# Patient Record
Sex: Female | Born: 1995 | Race: Black or African American | Hispanic: No | Marital: Single | Smoking: Never smoker
Health system: Southern US, Community
[De-identification: ages and names within clinical notes are randomized; demographics above are authoritative.]

---

## 2020-02-23 ENCOUNTER — Encounter: Payer: Self-pay | Admitting: Emergency Medicine

## 2020-02-23 ENCOUNTER — Emergency Department
Admission: EM | Admit: 2020-02-23 | Discharge: 2020-02-23 | Disposition: A | Discharge status: Home / Self Care | Payer: Managed Care, Other (non HMO) | Attending: Emergency Medicine | Admitting: Emergency Medicine

## 2020-02-23 ENCOUNTER — Emergency Department: Payer: Managed Care, Other (non HMO)

## 2020-02-23 ENCOUNTER — Other Ambulatory Visit: Payer: Self-pay

## 2020-02-23 DIAGNOSIS — R002 Palpitations: Secondary | ICD-10-CM | POA: Insufficient documentation

## 2020-02-23 DIAGNOSIS — R0602 Shortness of breath: Secondary | ICD-10-CM | POA: Diagnosis present

## 2020-02-23 DIAGNOSIS — Z5321 Procedure and treatment not carried out due to patient leaving prior to being seen by health care provider: Secondary | ICD-10-CM | POA: Diagnosis not present

## 2020-02-23 LAB — BASIC METABOLIC PANEL
Anion gap: 11 (ref 5–15)
BUN: 8 mg/dL (ref 6–20)
CO2: 26 mmol/L (ref 22–32)
Calcium: 9.4 mg/dL (ref 8.9–10.3)
Chloride: 102 mmol/L (ref 98–111)
Creatinine, Ser: 0.78 mg/dL (ref 0.44–1.00)
GFR, Estimated: >60 mL/min (ref >60)
Glucose, Bld: 94 mg/dL (ref 70–99)
Potassium: 3.7 mmol/L (ref 3.5–5.1)
Sodium: 139 mmol/L (ref 135–145)

## 2020-02-23 LAB — CBC
HCT: 39 % (ref 36.0–46.0)
Hemoglobin: 12.9 g/dL (ref 12.0–15.0)
MCH: 29.4 pg (ref 26.0–34.0)
MCHC: 33.1 g/dL (ref 30.0–36.0)
MCV: 88.8 fL (ref 80.0–100.0)
Platelets: 291 10*3/uL (ref 150–400)
RBC: 4.39 MIL/uL (ref 3.87–5.11)
RDW: 12.2 % (ref 11.5–15.5)
WBC: 8.4 10*3/uL (ref 4.0–10.5)
nRBC: 0 % (ref 0.0–0.2)

## 2020-02-23 LAB — TROPONIN I (HIGH SENSITIVITY)
Troponin I (High Sensitivity): <2 ng/L (ref <18)
Troponin I (High Sensitivity): <2 ng/L (ref <18)

## 2020-02-23 LAB — POCT PREGNANCY, URINE: Preg Test, Ur: NEGATIVE

## 2020-02-23 NOTE — ED Notes (Signed)
Pt to stat desk states she is leaving due to long wait times.  Will return if sx worsen or change

## 2020-02-23 NOTE — ED Triage Notes (Signed)
Pt reports she has been feeling for the past week she has been experiencing shortness of breath and palpitations, pt reports episodes come and go,reports experienced one episode this morning. Pt talks in complete sentences no respiratory distress noted

## 2020-02-24 ENCOUNTER — Encounter: Payer: Self-pay | Admitting: Emergency Medicine

## 2020-02-24 ENCOUNTER — Ambulatory Visit
Admission: RE | Admit: 2020-02-24 | Discharge: 2020-02-24 | Disposition: A | Discharge status: Home / Self Care | Payer: Managed Care, Other (non HMO) | Source: Ambulatory Visit | Attending: Family Medicine | Admitting: Family Medicine

## 2020-02-24 VITALS — BP 138/99 | HR 86 | Temp 98.7°F | Resp 16

## 2020-02-24 DIAGNOSIS — R0602 Shortness of breath: Secondary | ICD-10-CM

## 2020-02-24 DIAGNOSIS — I10 Essential (primary) hypertension: Secondary | ICD-10-CM

## 2020-02-24 DIAGNOSIS — F411 Generalized anxiety disorder: Secondary | ICD-10-CM | POA: Diagnosis not present

## 2020-02-24 DIAGNOSIS — R002 Palpitations: Secondary | ICD-10-CM

## 2020-02-24 DIAGNOSIS — Z1152 Encounter for screening for COVID-19: Secondary | ICD-10-CM

## 2020-02-24 MED ORDER — HYDROXYZINE HCL 10 MG PO TABS: 10.0000 mg | ORAL_TABLET | Freq: Three times a day (TID) | ORAL | 0 refills | Status: AC | PRN

## 2020-02-24 NOTE — ED Triage Notes (Signed)
Patient reports palpitations and shortness of breath x 1 week. Patient went to ED on Saturday--EKG, Chest Xray and labs completed. Patient was not seen by a provider.   Patient states symptoms have continued and would like to review information from Saturday.

## 2020-02-24 NOTE — ED Provider Notes (Signed)
Cindy Chambers    CSN: [id number] Arrival date & time: 02/24/20  1024      History   Chief Complaint Chief Complaint  Patient presents with  . Palpitations  . Shortness of Breath    HPI Cindy Chambers is a 24 y.o. female.   Reports that she has been experiencing palpitations and shortness of breath and episodes together for about the past week.  Reports that the episodes last from 2 to 3 minutes to 30 minutes.  Reports that she had the worst episode of this Saturday night and then went to the ER.  She does not have significant medical history.  Reports that they took her blood did an x-ray as well, and an EKG.  She did not stay due to long wait times.  She was wondering what her labs looked like from ther  Denies headache, cough, shortness of breath, nausea, vomiting, diarrhea, rash, fever, other symptoms.  Requesting Covid testing while she is here.  Has negative history of Covid has completed Covid vaccines.   ROS per HPI  The history is provided by the patient.  Palpitations Associated symptoms: shortness of breath   Shortness of Breath   History reviewed. No pertinent past medical history.  There are no problems to display for this patient.   History reviewed. No pertinent surgical history.  OB History   No obstetric history on file.      Home Medications    Prior to Admission medications   Medication Sig Start Date End Date Taking? Authorizing Provider  hydrOXYzine (ATARAX/VISTARIL) 10 MG tablet Take 1 tablet (10 mg total) by mouth 3 (three) times daily as needed. 02/24/20   Moshe Cipro, NP    Family History History reviewed. No pertinent family history.  Social History Social History   Tobacco Use  . Smoking status: Never Smoker  . Smokeless tobacco: Never Used  Substance Use Topics  . Alcohol use: Not on file  . Drug use: Not on file     Allergies   Patient has no known allergies.   Review of Systems Review of Systems    Respiratory: Positive for shortness of breath.   Cardiovascular: Positive for palpitations.     Physical Exam Triage Vital Signs ED Triage Vitals  Enc Vitals Group     BP 02/24/20 1101 (!) 138/99     Pulse Rate 02/24/20 1101 86     Resp 02/24/20 1101 16     Temp 02/24/20 1101 98.7 F (37.1 C)     Temp Source 02/24/20 1101 Oral     SpO2 02/24/20 1101 98 %     Weight --      Height --      Head Circumference --      Peak Flow --      Pain Score 02/24/20 1044 2     Pain Loc --      Pain Edu? --      Excl. in GC? --    No data found.  Updated Vital Signs BP (!) 138/99 (BP Location: Left Arm)   Pulse 86   Temp 98.7 F (37.1 C) (Oral)   Resp 16   LMP 01/17/2020   SpO2 98%   Visual Acuity Right Eye Distance:   Left Eye Distance:   Bilateral Distance:    Right Eye Near:   Left Eye Near:    Bilateral Near:     Physical Exam Vitals and nursing note reviewed.  Constitutional:  General: She is not in acute distress.    Appearance: She is well-developed. She is obese. She is not ill-appearing.  HENT:     Head: Normocephalic and atraumatic.     Nose: Nose normal.     Mouth/Throat:     Mouth: Mucous membranes are moist.     Pharynx: Oropharynx is clear.  Eyes:     Extraocular Movements: Extraocular movements intact.     Conjunctiva/sclera: Conjunctivae normal.     Pupils: Pupils are equal, round, and reactive to light.  Cardiovascular:     Rate and Rhythm: Normal rate and regular rhythm.     Heart sounds: Normal heart sounds. No murmur heard.   Pulmonary:     Effort: Pulmonary effort is normal. No respiratory distress.     Breath sounds: Normal breath sounds. No stridor. No wheezing, rhonchi or rales.  Chest:     Chest wall: No tenderness.  Abdominal:     General: Bowel sounds are normal.     Palpations: Abdomen is soft.     Tenderness: There is no abdominal tenderness.  Musculoskeletal:        General: Normal range of motion.     Cervical back:  Normal range of motion and neck supple.  Skin:    General: Skin is warm and dry.     Capillary Refill: Capillary refill takes less than 2 seconds.  Neurological:     General: No focal deficit present.     Mental Status: She is alert and oriented to person, place, and time.  Psychiatric:        Mood and Affect: Mood normal.        Behavior: Behavior normal.        Thought Content: Thought content normal.      UC Treatments / Results  Labs (all labs ordered are listed, but only abnormal results are displayed) Labs Reviewed  NOVEL CORONAVIRUS, NAA    EKG   Radiology No results found.  Procedures Procedures (including critical care time)  Medications Ordered in UC Medications - No data to display  Initial Impression / Assessment and Plan / UC Course  I have reviewed the triage vital signs and the nursing notes.  Pertinent labs & imaging results that were available during my care of the patient were reviewed by me and considered in my medical decision making (see chart for details).     Palpitations SOB Covid screen Anxiety  Presents today with palpitations and shortness of breath that occur episodically together Has never had any issues with anxiety or high blood pressure before BP elevated in office today Discussed keeping blood pressure log for the next 2 weeks the follow-up with primary care Most likely anxiety as she states that her thoughts tend to race and she has a hard time falling asleep Prescribed hydroxyzine as needed Covid swab obtained in office today.  Patient instructed to quarantine until results are back and negative.  If results are negative, patient may resume daily schedule as tolerated once they are fever free for 24 hours without the use of antipyretic medications.  If results are positive, patient instructed to quarantine 10 days from today.  Patient instructed to follow-up with primary care with this office as needed.  Patient instructed to  follow-up in the ER for trouble swallowing, trouble breathing, other concerning symptoms.  Final Clinical Impressions(s) / UC Diagnoses   Final diagnoses:  Palpitations  SOB (shortness of breath)  Encounter for screening for COVID-19  Anxiety  state     Discharge Instructions     Your COVID test is pending.  You should self quarantine until the test result is back.    Take Tylenol as needed for fever or discomfort.  Rest and keep yourself hydrated.    Go to the emergency department if you develop acute worsening symptoms.        ED Prescriptions    Medication Sig Dispense Auth. Provider   hydrOXYzine (ATARAX/VISTARIL) 10 MG tablet Take 1 tablet (10 mg total) by mouth 3 (three) times daily as needed. 30 tablet Moshe Cipro, NP     PDMP not reviewed this encounter.   Moshe Cipro, NP 02/24/20 1204

## 2020-02-24 NOTE — Discharge Instructions (Signed)
Your COVID test is pending.  You should self quarantine until the test result is back.    Take Tylenol as needed for fever or discomfort.  Rest and keep yourself hydrated.    Go to the emergency department if you develop acute worsening symptoms.     

## 2020-02-25 LAB — NOVEL CORONAVIRUS, NAA: SARS-CoV-2, NAA: NOT DETECTED

## 2020-02-25 LAB — SARS-COV-2, NAA 2 DAY TAT

## 2020-03-05 ENCOUNTER — Emergency Department
Admission: EM | Admit: 2020-03-05 | Discharge: 2020-03-05 | Disposition: A | Discharge status: Home / Self Care | Payer: Managed Care, Other (non HMO) | Attending: Emergency Medicine | Admitting: Emergency Medicine

## 2020-03-05 ENCOUNTER — Other Ambulatory Visit: Payer: Self-pay

## 2020-03-05 DIAGNOSIS — J029 Acute pharyngitis, unspecified: Secondary | ICD-10-CM | POA: Insufficient documentation

## 2020-03-05 DIAGNOSIS — Z20822 Contact with and (suspected) exposure to covid-19: Secondary | ICD-10-CM | POA: Insufficient documentation

## 2020-03-05 LAB — GROUP A STREP BY PCR: Group A Strep by PCR: NOT DETECTED

## 2020-03-05 LAB — RESPIRATORY PANEL BY RT PCR (FLU A&B, COVID)
Influenza A by PCR: NEGATIVE
Influenza B by PCR: NEGATIVE
SARS Coronavirus 2 by RT PCR: NEGATIVE

## 2020-03-05 NOTE — ED Notes (Signed)
Patient declined discharge vital signs. 

## 2020-03-05 NOTE — ED Triage Notes (Signed)
Pt states feels like tonsils are swollen states normally gets tonsillitis yearly.

## 2020-03-05 NOTE — ED Provider Notes (Signed)
Emergency Department Provider Note  ____________________________________________  Time seen: Approximately 10:20 PM  I have reviewed the triage vital signs and the nursing notes.   HISTORY  Chief Complaint Sore Throat   Historian Patient     HPI Cindy Chambers is a 24 y.o. female presents to the emergency department with concern for an episode of globus sensation that occurred while patient was driving.  Patient states that she looked at the back of her throat after she arrived at her destination and noticed her uvula sticking to her tonsil and became concerned.  She denies difficulty swallowing, pain underneath the tongue or current pharyngitis.  No fever or chills.  No shortness of breath.  No other alleviating measures have been attempted.   No past medical history on file.   Immunizations up to date:  Yes.     No past medical history on file.  There are no problems to display for this patient.   No past surgical history on file.  Prior to Admission medications   Medication Sig Start Date End Date Taking? Authorizing Provider  hydrOXYzine (ATARAX/VISTARIL) 10 MG tablet Take 1 tablet (10 mg total) by mouth 3 (three) times daily as needed. 02/24/20   Moshe Cipro, NP    Allergies Patient has no known allergies.  No family history on file.  Social History Social History   Tobacco Use  . Smoking status: Never Smoker  . Smokeless tobacco: Never Used  Substance Use Topics  . Alcohol use: Not on file  . Drug use: Not on file     Review of Systems  Constitutional: No fever/chills Eyes:  No discharge ENT: No upper respiratory complaints. Respiratory: no cough. No SOB/ use of accessory muscles to breath Gastrointestinal:   No nausea, no vomiting.  No diarrhea.  No constipation. Musculoskeletal: Negative for musculoskeletal pain. Skin: Negative for rash, abrasions, lacerations,  ecchymosis.    ____________________________________________   PHYSICAL EXAM:  VITAL SIGNS: ED Triage Vitals  Enc Vitals Group     BP 03/05/20 1911 (!) 150/84     Pulse Rate 03/05/20 1911 75     Resp 03/05/20 1911 18     Temp 03/05/20 1910 98.7 F (37.1 C)     Temp Source 03/05/20 1910 Oral     SpO2 03/05/20 1911 100 %     Weight 03/05/20 1910 203 lb (92.1 kg)     Height 03/05/20 1910 5\' 3"  (1.6 m)     Head Circumference --      Peak Flow --      Pain Score 03/05/20 1910 3     Pain Loc --      Pain Edu? --      Excl. in GC? --      Constitutional: Alert and oriented. Well appearing and in no acute distress. Eyes: Conjunctivae are normal. PERRL. EOMI. Head: Atraumatic. ENT:      Ears: TMs are pearly.       Nose: No congestion/rhinnorhea.      Mouth/Throat: Mucous membranes are moist.  No uvular deviation.  Tonsils appear symmetrical without hypertrophy.  No tonsillar exudate. Neck: No stridor.  No cervical spine tenderness to palpation. Cardiovascular: Normal rate, regular rhythm. Normal S1 and S2.  Good peripheral circulation. Respiratory: Normal respiratory effort without tachypnea or retractions. Lungs CTAB. Good air entry to the bases with no decreased or absent breath sounds Musculoskeletal: Full range of motion to all extremities. No obvious deformities noted Neurologic:  Normal for age. No gross focal  neurologic deficits are appreciated.  Skin:  Skin is warm, dry and intact. No rash noted. Psychiatric: Mood and affect are normal for age. Speech and behavior are normal.   ____________________________________________   LABS (all labs ordered are listed, but only abnormal results are displayed)  Labs Reviewed  GROUP A STREP BY PCR  RESPIRATORY PANEL BY RT PCR (FLU A&B, COVID)   ____________________________________________  EKG   ____________________________________________  RADIOLOGY   No results  found.  ____________________________________________    PROCEDURES  Procedure(s) performed:     Procedures     Medications - No data to display   ____________________________________________   INITIAL IMPRESSION / ASSESSMENT AND PLAN / ED COURSE  Pertinent labs & imaging results that were available during my care of the patient were reviewed by me and considered in my medical decision making (see chart for details).      Assessment and Plan:  Pharyngitis 24 year old female presents to the emergency department after resolved episode of globus sensation.  Patient was mildly hypertensive at triage but vital signs were otherwise reassuring.  On physical exam, tonsils appeared symmetrical with no uvular deviation.  Patient tested negative for group A strep.  COVID-19 testing is in process at this time.  Reassurance was given.  All patient questions were answered.   ____________________________________________  FINAL CLINICAL IMPRESSION(S) / ED DIAGNOSES  Final diagnoses:  Pharyngitis, unspecified etiology      NEW MEDICATIONS STARTED DURING THIS VISIT:  ED Discharge Orders    None          This chart was dictated using voice recognition software/Dragon. Despite best efforts to proofread, errors can occur which can change the meaning. Any change was purely unintentional.     Orvil Feil, PA-C 03/05/20 2225    Shaune Pollack, MD 03/10/20 [phone]

## 2020-05-04 ENCOUNTER — Ambulatory Visit
Admission: EM | Admit: 2020-05-04 | Discharge: 2020-05-04 | Disposition: A | Discharge status: Home / Self Care | Payer: Managed Care, Other (non HMO) | Attending: Emergency Medicine | Admitting: Emergency Medicine

## 2020-05-04 DIAGNOSIS — J358 Other chronic diseases of tonsils and adenoids: Secondary | ICD-10-CM | POA: Diagnosis present

## 2020-05-04 LAB — POCT RAPID STREP A (OFFICE): Rapid Strep A Screen: NEGATIVE

## 2020-05-04 NOTE — Discharge Instructions (Signed)
Negative today for strep.  Your exam is consistent with a tonsil stone, which can recur for some people.  Gargling, or even use of a water pic to extract stones may provide comfort.  Stay well hydrated to try to prevent these.  Follow up with ENT as needed if recurrence or worsening.

## 2020-05-04 NOTE — ED Provider Notes (Signed)
[name]    CSN: [id number] Arrival date & time: 05/04/20  1337      History   Chief Complaint Chief Complaint  Patient presents with   Sore Throat    HPI Carnelia [place] is a 24 y.o. female.   Right tonsilar pain over the past few days. She was seen and evaluated for sore throat 10/21 in the ER with negative testing. Symptoms worsened, she did an e-visit and was provided course of amoxicillin which she finished approximately a month ago, symptoms had improved. Noted "white spot" to right tonsil a few days ago again which worried her. No fevers. Mild right ear pain. No other congestion or cough. No difficulty swallowing.    ROS per HPI, negative if not otherwise mentioned.      History reviewed. No pertinent past medical history.  There are no problems to display for this patient.   History reviewed. No pertinent surgical history.  OB History   No obstetric history on file.      Home Medications    Prior to Admission medications   Medication Sig Start Date End Date Taking? Authorizing Provider  hydrOXYzine (ATARAX/VISTARIL) 10 MG tablet Take 1 tablet (10 mg total) by mouth 3 (three) times daily as needed. 02/24/20   Moshe Cipro, NP    Family History No family history on file.  Social History Social History   Tobacco Use   Smoking status: Never Smoker   Smokeless tobacco: Never Used  Substance Use Topics   Alcohol use: Yes    Comment: occ     Allergies   Patient has no known allergies.   Review of Systems Review of Systems   Physical Exam Triage Vital Signs ED Triage Vitals  Enc Vitals Group     BP 05/04/20 1347 (!) 152/95     Pulse Rate 05/04/20 1347 94     Resp 05/04/20 1347 16     Temp 05/04/20 1347 98.9 F (37.2 C)     Temp Source 05/04/20 1347 Oral     SpO2 05/04/20 1347 97 %     Weight 05/04/20 1347 200 lb (90.7 kg)     Height 05/04/20 1347 5\' 2"  (1.575 m)     Head Circumference --      Peak Flow --       Pain Score 05/04/20 1348 8     Pain Loc --      Pain Edu? --      Excl. in GC? --    No data found.  Updated Vital Signs BP (!) 152/95    Pulse 94    Temp 98.9 F (37.2 C) (Oral)    Resp 16    Ht 5\' 2"  (1.575 m)    Wt 200 lb (90.7 kg)    LMP 04/11/2020    SpO2 97%    BMI 36.58 kg/m    Physical Exam Constitutional:      General: She is not in acute distress.    Appearance: She is well-developed.  HENT:     Head: Normocephalic and atraumatic.     Right Ear: Tympanic membrane and ear canal normal.     Left Ear: Tympanic membrane and ear canal normal.     Mouth/Throat:     Mouth: Mucous membranes are moist.     Pharynx: Uvula midline. No oropharyngeal exudate, posterior oropharyngeal erythema or uvula swelling.     Tonsils: No tonsillar exudate or tonsillar abscesses. 1+ on the right. 1+ on the  left.     Comments: Tonsil stone x1 noted to right tonsil  Cardiovascular:     Rate and Rhythm: Normal rate.  Pulmonary:     Effort: Pulmonary effort is normal.  Skin:    General: Skin is warm and dry.  Neurological:     Mental Status: She is alert and oriented to person, place, and time.      UC Treatments / Results  Labs (all labs ordered are listed, but only abnormal results are displayed) Labs Reviewed  CULTURE, GROUP A STREP [facility])  POCT RAPID STREP A (OFFICE)    EKG   Radiology No results found.  Procedures Procedures (including critical care time)  Medications Ordered in UC Medications - No data to display  Initial Impression / Assessment and Plan / UC Course  I have reviewed the triage vital signs and the nursing notes.  Pertinent labs & imaging results that were available during my care of the patient were reviewed by me and considered in my medical decision making (see chart for details).     Negative rapid strep again here today, consistent with exam. Single tonsil stone noted. Treatment and prevention, supportive cares recommended. Follow up with ENT  prn recommended. Patient verbalized understanding and agreeable to plan.   Final Clinical Impressions(s) / UC Diagnoses   Final diagnoses:  Tonsil stone     Discharge Instructions     Negative today for strep.  Your exam is consistent with a tonsil stone, which can recur for some people.  Gargling, or even use of a water pic to extract stones may provide comfort.  Stay well hydrated to try to prevent these.  Follow up with ENT as needed if recurrence or worsening.     ED Prescriptions    None     PDMP not reviewed this encounter.   Georgetta Haber, NP 05/04/20 1422

## 2020-05-04 NOTE — ED Triage Notes (Signed)
Pt reports having a sore throat "for awhile". sts she was seen in Oct at the ED and tested for strep, test was neg. sts her tonsils are swollen and have some white spots in back of throatt. Also sts she had an e-visit on Nov. 23 and was prescribed abx.

## 2020-05-06 LAB — CULTURE, GROUP A STREP (THRC)

## 2021-05-28 ENCOUNTER — Telehealth: Payer: Self-pay

## 2021-05-28 NOTE — Telephone Encounter (Signed)
FYI  Placed call to pt to triage due to her new patient appt notes saying chest pain. Pt states its been going on for awhile. She went to [facility] in 2021 for it and they stated she was having anxiety. Pt states the chest pain is on and off and she doesn't usually have nay other symptoms along with it. I advised pt to go to the ER again if it gets worse and to keep her New pt appt scheduled for 3/8. Informed pt to call office back to check if there are any cancellations.

## 2021-05-29 NOTE — Telephone Encounter (Signed)
Yes agree she needs to go to the ER now  chest pain is really not appropriate for new patient establish care visit. She needs work up that vannot be done in office I recommend ER and follow up as new patient afterwards.

## 2021-07-01 ENCOUNTER — Ambulatory Visit: Payer: Managed Care, Other (non HMO) | Admitting: Adult Health

## 2021-07-21 ENCOUNTER — Ambulatory Visit: Payer: Managed Care, Other (non HMO) | Admitting: Adult Health

## 2022-06-07 IMAGING — DX DG CHEST 1V PORT
1 series · 1 of 1 positions shown · non-contrast
Comparison: None.

CLINICAL DATA: Chest pain

EXAM:
PORTABLE CHEST 1 VIEW

[chest ap]
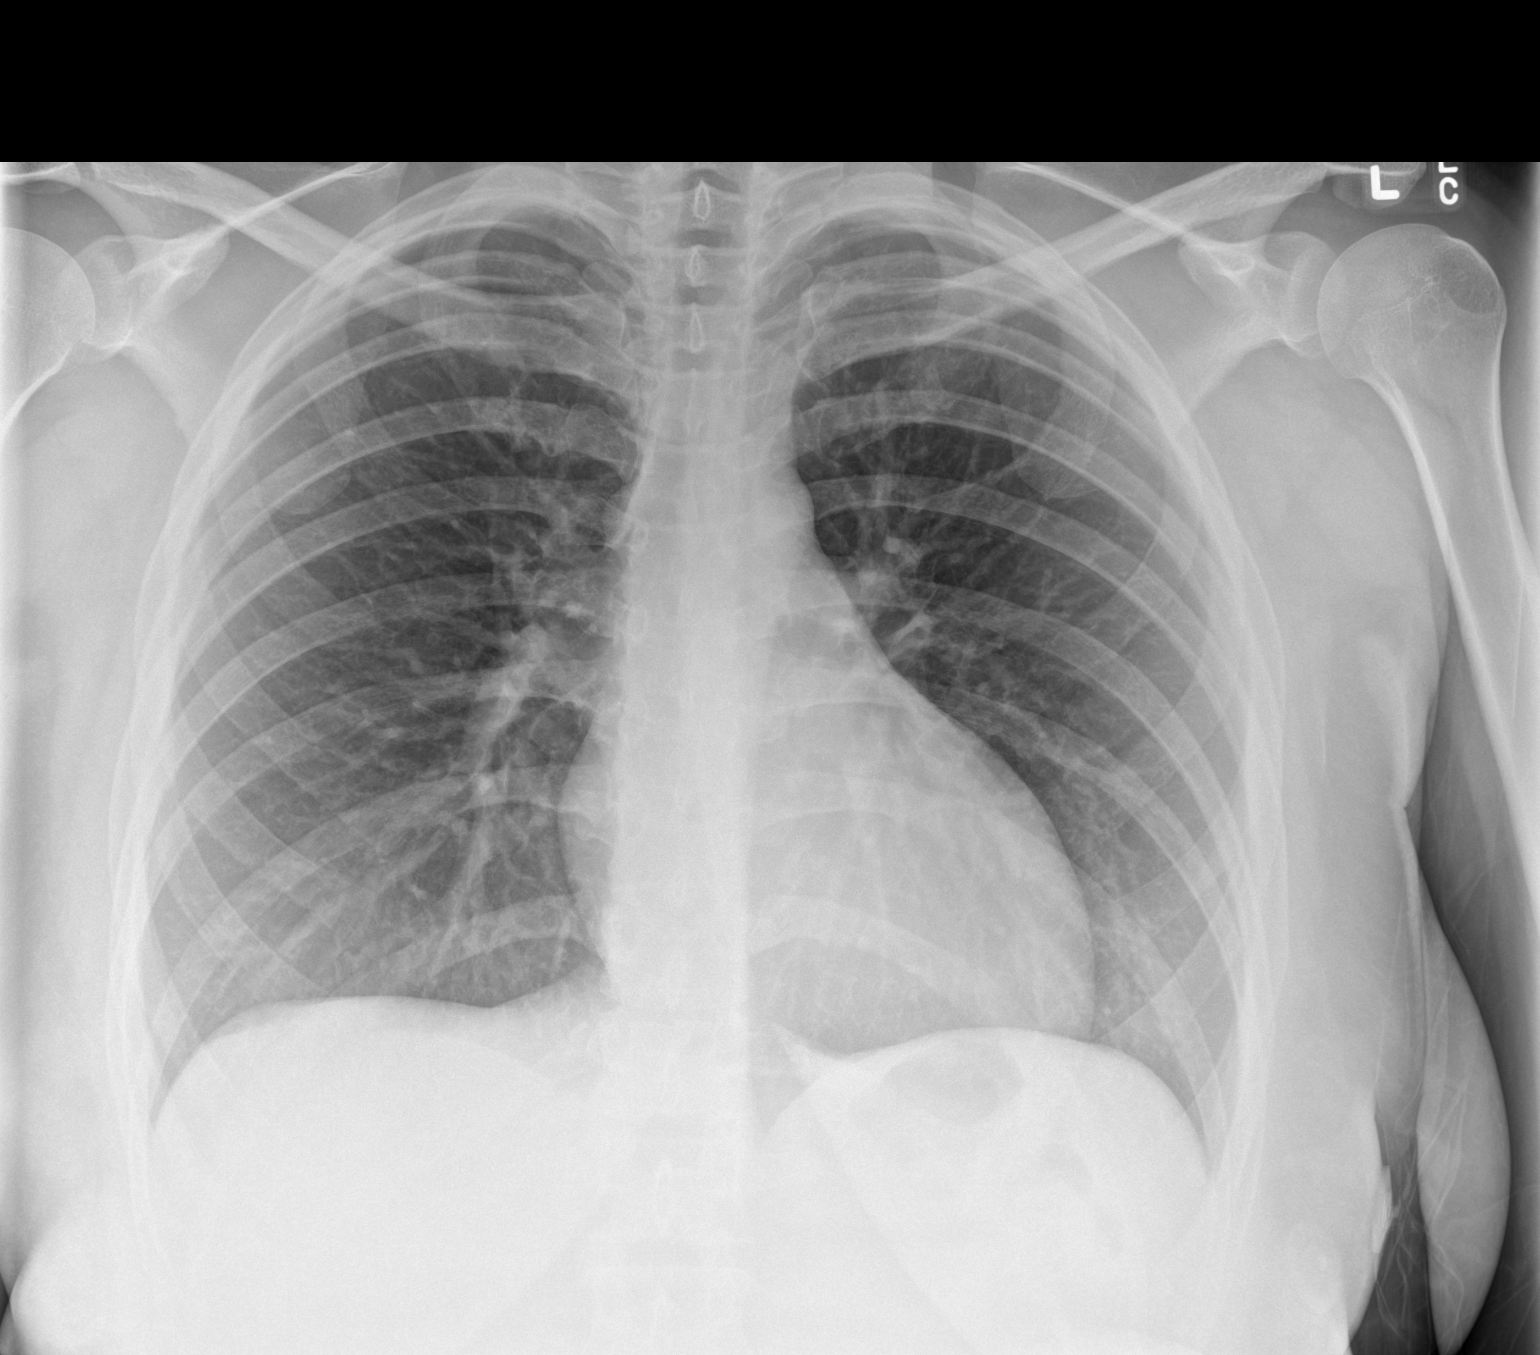

[1 of 1 positions shown; findings below may reference images not displayed]

FINDINGS: The heart size and mediastinal contours are within normal limits.
Both lungs are clear. The visualized skeletal structures are
unremarkable.
IMPRESSION: No active disease.
